# Patient Record
Sex: Male | Born: 1940 | Race: White | Hispanic: No | Marital: Married | Smoking: Former smoker
Health system: Southern US, Community
[De-identification: ages and names within clinical notes are randomized; demographics above are authoritative.]

## PROBLEM LIST (undated history)

## (undated) DIAGNOSIS — K8020 Calculus of gallbladder without cholecystitis without obstruction: Secondary | ICD-10-CM

## (undated) DIAGNOSIS — R413 Other amnesia: Secondary | ICD-10-CM

---

## 1999-12-08 ENCOUNTER — Other Ambulatory Visit: Admission: RE | Admit: 1999-12-08 | Discharge: 1999-12-08 | Payer: Self-pay | Admitting: Gastroenterology

## 2000-03-29 ENCOUNTER — Ambulatory Visit (HOSPITAL_COMMUNITY): Admission: RE | Admit: 2000-03-29 | Discharge: 2000-03-29 | Payer: Self-pay | Admitting: Gastroenterology

## 2005-02-23 HISTORY — PX: COLON SURGERY: SHX602

## 2005-03-25 ENCOUNTER — Ambulatory Visit (HOSPITAL_COMMUNITY): Admission: RE | Admit: 2005-03-25 | Discharge: 2005-03-25 | Payer: Self-pay | Admitting: Gastroenterology

## 2005-07-02 ENCOUNTER — Inpatient Hospital Stay (HOSPITAL_COMMUNITY): Admission: RE | Admit: 2005-07-02 | Discharge: 2005-07-10 | Payer: Self-pay | Admitting: General Surgery

## 2006-02-23 HISTORY — PX: OTHER SURGICAL HISTORY: SHX169

## 2008-02-24 HISTORY — PX: SHOULDER SURGERY: SHX246

## 2011-11-25 ENCOUNTER — Other Ambulatory Visit: Payer: Self-pay | Admitting: Geriatric Medicine

## 2011-11-25 DIAGNOSIS — R4701 Aphasia: Secondary | ICD-10-CM

## 2011-11-29 ENCOUNTER — Other Ambulatory Visit: Payer: Self-pay

## 2011-11-30 ENCOUNTER — Ambulatory Visit
Admission: RE | Admit: 2011-11-30 | Discharge: 2011-11-30 | Disposition: A | Discharge status: Home / Self Care | Payer: Medicare Other | Source: Ambulatory Visit | Attending: Geriatric Medicine | Admitting: Geriatric Medicine

## 2011-11-30 DIAGNOSIS — R4701 Aphasia: Secondary | ICD-10-CM

## 2011-11-30 MED ORDER — GADOBENATE DIMEGLUMINE 529 MG/ML IV SOLN
14.0000 mL | Freq: Once | INTRAVENOUS | Status: AC | PRN
  Administered 2011-11-30: 14 mL via INTRAVENOUS

## 2011-12-17 ENCOUNTER — Other Ambulatory Visit: Payer: Self-pay | Admitting: Geriatric Medicine

## 2011-12-17 ENCOUNTER — Ambulatory Visit (HOSPITAL_COMMUNITY)
Admission: RE | Admit: 2011-12-17 | Discharge: 2011-12-17 | Disposition: A | Discharge status: Home / Self Care | Payer: Medicare Other | Source: Ambulatory Visit | Attending: Geriatric Medicine | Admitting: Geriatric Medicine

## 2011-12-17 ENCOUNTER — Other Ambulatory Visit (HOSPITAL_COMMUNITY): Payer: Self-pay | Admitting: Geriatric Medicine

## 2011-12-17 DIAGNOSIS — K838 Other specified diseases of biliary tract: Secondary | ICD-10-CM | POA: Insufficient documentation

## 2011-12-17 DIAGNOSIS — R109 Unspecified abdominal pain: Secondary | ICD-10-CM

## 2011-12-17 DIAGNOSIS — K7689 Other specified diseases of liver: Secondary | ICD-10-CM | POA: Insufficient documentation

## 2011-12-17 DIAGNOSIS — R1011 Right upper quadrant pain: Secondary | ICD-10-CM

## 2011-12-17 DIAGNOSIS — K8020 Calculus of gallbladder without cholecystitis without obstruction: Secondary | ICD-10-CM | POA: Insufficient documentation

## 2011-12-17 DIAGNOSIS — Q619 Cystic kidney disease, unspecified: Secondary | ICD-10-CM | POA: Insufficient documentation

## 2011-12-18 ENCOUNTER — Other Ambulatory Visit: Payer: Medicare Other

## 2011-12-18 ENCOUNTER — Ambulatory Visit (HOSPITAL_COMMUNITY): Payer: Medicare Other

## 2011-12-18 ENCOUNTER — Other Ambulatory Visit (HOSPITAL_COMMUNITY): Payer: Medicare Other

## 2012-01-11 ENCOUNTER — Encounter (INDEPENDENT_AMBULATORY_CARE_PROVIDER_SITE_OTHER): Payer: Self-pay | Admitting: General Surgery

## 2012-01-11 ENCOUNTER — Ambulatory Visit (INDEPENDENT_AMBULATORY_CARE_PROVIDER_SITE_OTHER): Payer: Medicare Other | Admitting: General Surgery

## 2012-01-11 VITALS — BP 144/80 | HR 73 | Temp 99.0°F | Ht 68.5 in | Wt 161.4 lb

## 2012-01-11 DIAGNOSIS — K8020 Calculus of gallbladder without cholecystitis without obstruction: Secondary | ICD-10-CM | POA: Insufficient documentation

## 2012-01-11 NOTE — Progress Notes (Signed)
Patient ID: Eric Combs, male   DOB: Jan 18, 1941, 71 y.o.   MRN: [id number]  Chief Complaint  Patient presents with  . Pre-op Exam    eval gallstones    HPI Eric Combs is a 71 y.o. male.   HPI  He is referred by Dr. Pete Glatter for further evaluation of right upper quadrant pain and gallstones.  He has had 2 episodes of right upper quadrant pain 2 hours after eating. No nausea or vomiting. No fever or chills. One episode lasted 10 hours. He subsequently underwent an abdominal ultrasound which demonstrated multiple gallstones with the largest one measuring 1.1 cm. There was no significant gallbladder wall thickening. The common bile duct diameter was 7 mm.  History reviewed. No pertinent past medical history.  Past Surgical History  Procedure Date  . Shoulder surgery 2010    right  . Polyp removal 2008  . Colon surgery 2007     Small bowel resection  Family History  Problem Relation Age of Onset  . Cancer Father     kidney,lung,brain    Social History History  Substance Use Topics  . Smoking status: Former Smoker    Quit date: 01/11/2008  . Smokeless tobacco: Not on file  . Alcohol Use: No    No Known Allergies  Current Outpatient Prescriptions  Medication Sig Dispense Refill  . donepezil (ARICEPT) 5 MG tablet Take 5 mg by mouth at bedtime as needed.        Review of Systems Review of Systems  Constitutional: Negative for fever and chills.  Respiratory: Negative for shortness of breath.   Cardiovascular: Negative for chest pain.  Gastrointestinal: Positive for abdominal pain. Negative for nausea and vomiting.  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    Blood pressure 144/80, pulse 73, temperature 99 F (37.2 C), temperature source Temporal, height 5' 8.5" (1.74 m), weight 161 lb 6.4 oz (73.211 kg), SpO2 98.00%.  Physical Exam Physical Exam  Constitutional: He appears well-developed and well-nourished. No distress.  HENT:    Head: Normocephalic and atraumatic.  Eyes: EOM are normal. No scleral icterus.  Neck: Neck supple.  Cardiovascular: Normal rate and regular rhythm.   Pulmonary/Chest: Effort normal and breath sounds normal.  Abdominal: Soft. He exhibits no distension and no mass. There is no tenderness.       Midline scar.  Musculoskeletal: Normal range of motion. He exhibits edema.  Lymphadenopathy:    He has no cervical adenopathy.  Neurological: He is alert.       Answers questions appropriately.  Skin: Skin is warm and dry.    Data Reviewed [place] report, Dr. Laverle Hobby notes, old chart.  Assessment    His right upper quadrant pain is very suggestive of symptomatic cholelithiasis and we discussed this.    Plan    We talked about a laparoscopic cholecystectomy for this and he is interested in proceeding.  I have explained the procedure, risks, and aftercare of cholecystectomy.  Risks include but are not limited to bleeding, infection, wound problems, anesthesia, diarrhea, bile leak, injury to common bile duct/liver/intestine.  He seems to understand and agrees to proceed.        Merdith Adan J 01/11/2012, 5:57 PM

## 2012-01-11 NOTE — Patient Instructions (Signed)
Strict lowfat diet. 

## 2012-02-05 ENCOUNTER — Encounter (HOSPITAL_COMMUNITY): Payer: Self-pay | Admitting: Pharmacy Technician

## 2012-02-09 ENCOUNTER — Encounter (HOSPITAL_COMMUNITY)
Admission: RE | Admit: 2012-02-09 | Discharge: 2012-02-09 | Disposition: A | Discharge status: Home / Self Care | Payer: Medicare Other | Source: Ambulatory Visit | Attending: General Surgery | Admitting: General Surgery

## 2012-02-09 ENCOUNTER — Encounter (HOSPITAL_COMMUNITY): Payer: Self-pay

## 2012-02-09 HISTORY — DX: Other amnesia: R41.3

## 2012-02-09 HISTORY — DX: Calculus of gallbladder without cholecystitis without obstruction: K80.20

## 2012-02-09 LAB — CBC WITH DIFFERENTIAL/PLATELET
Basophils Absolute: 0.1 10*3/uL (ref 0.0–0.1)
Hemoglobin: 14.7 g/dL (ref 13.0–17.0)
Lymphs Abs: 1.1 10*3/uL (ref 0.7–4.0)
MCH: 27.9 pg (ref 26.0–34.0)
Neutro Abs: 4.9 10*3/uL (ref 1.7–7.7)
Neutrophils Relative %: 74 % (ref 43–77)
RDW: 14.2 % (ref 11.5–15.5)

## 2012-02-09 LAB — COMPREHENSIVE METABOLIC PANEL
Albumin: 4 g/dL (ref 3.5–5.2)
CO2: 28 mEq/L (ref 19–32)
Chloride: 102 mEq/L (ref 96–112)
GFR calc Af Amer: >90 mL/min (ref >90)
GFR calc non Af Amer: 82 mL/min — ABNORMAL LOW (ref >90)
Potassium: 4.5 mEq/L (ref 3.5–5.1)
Sodium: 140 mEq/L (ref 135–145)
Total Bilirubin: 0.6 mg/dL (ref 0.3–1.2)
Total Protein: 7.4 g/dL (ref 6.0–8.3)

## 2012-02-09 LAB — SURGICAL PCR SCREEN
MRSA, PCR: NEGATIVE
Staphylococcus aureus: POSITIVE — AB

## 2012-02-09 NOTE — Patient Instructions (Signed)
YOUR SURGERY IS SCHEDULED AT [facility]  ON:  Friday  12/20  REPORT TO [facility] AT: 9:30 AM      PHONE # FOR SHORT STAY IS [phone] NO ASPIRIN OR HERBALS OR BLOOD THINNERS   DO NOT EAT OR DRINK ANYTHING AFTER MIDNIGHT THE NIGHT BEFORE YOUR SURGERY.  YOU MAY BRUSH YOUR TEETH, RINSE OUT YOUR MOUTH--BUT NO WATER, NO FOOD, NO CHEWING GUM, NO MINTS, NO CANDIES, NO CHEWING TOBACCO.  PLEASE TAKE THE FOLLOWING MEDICATIONS THE AM OF YOUR SURGERY WITH A FEW SIPS OF WATER:  NO MEDICINES TO TAKE.  IF YOU USE INHALERS--USE YOUR INHALERS THE AM OF YOUR SURGERY AND BRING INHALERS TO THE HOSPITAL -TAKE TO SURGERY.    IF YOU ARE DIABETIC:  DO NOT TAKE ANY DIABETIC MEDICATIONS THE AM OF YOUR SURGERY.  IF YOU TAKE INSULIN IN THE EVENINGS--PLEASE ONLY TAKE 1/2 NORMAL EVENING DOSE THE NIGHT BEFORE YOUR SURGERY.  NO INSULIN THE AM OF YOUR SURGERY.  IF YOU HAVE SLEEP APNEA AND USE CPAP OR BIPAP--PLEASE BRING THE MASK AND THE TUBING.  DO NOT BRING YOUR MACHINE.  DO NOT BRING VALUABLES, MONEY, CREDIT CARDS.  DO NOT WEAR JEWELRY, MAKE-UP, NAIL POLISH AND NO METAL PINS OR CLIPS IN YOUR HAIR. CONTACT LENS, DENTURES / PARTIALS, GLASSES SHOULD NOT BE WORN TO SURGERY AND IN MOST CASES-HEARING AIDS WILL NEED TO BE REMOVED.  BRING YOUR GLASSES CASE, ANY EQUIPMENT NEEDED FOR YOUR CONTACT LENS. FOR PATIENTS ADMITTED TO THE HOSPITAL--CHECK OUT TIME THE DAY OF DISCHARGE IS 11:00 AM.  ALL INPATIENT ROOMS ARE PRIVATE - WITH BATHROOM, TELEPHONE, TELEVISION AND WIFI INTERNET.  IF YOU ARE BEING DISCHARGED THE SAME DAY OF YOUR SURGERY--YOU CAN NOT DRIVE YOURSELF HOME--AND SHOULD NOT GO HOME ALONE BY TAXI OR BUS.  NO DRIVING OR OPERATING MACHINERY FOR 24 HOURS FOLLOWING ANESTHESIA / PAIN MEDICATIONS.  PLEASE MAKE ARRANGEMENTS FOR SOMEONE TO BE WITH YOU AT HOME THE FIRST 24 HOURS AFTER SURGERY. RESPONSIBLE DRIVER'S NAME___________________________                                               PHONE #    _______________________                               PLEASE READ OVER ANY  FACT SHEETS THAT YOU WERE GIVEN: MRSA INFORMATION, BLOOD TRANSFUSION INFORMATION, INCENTIVE SPIROMETER INFORMATION. FAILURE TO FOLLOW THESE INSTRUCTIONS MAY RESULT IN THE CANCELLATION OF YOUR SURGERY.   PATIENT SIGNATURE_________________________________

## 2012-02-09 NOTE — Pre-Procedure Instructions (Signed)
PREOP CBC, DIFF, CMET, PT WERE DONE TODAY AT [facility] AS PER ORDERS DR. Abbey Chatters.  EKG AND CXR NOT NEEDED PER ANESTHESIOLOGIST'S GUIDELINES.

## 2012-02-12 ENCOUNTER — Encounter (HOSPITAL_COMMUNITY)
Admission: RE | Disposition: A | Discharge status: Home / Self Care | Payer: Self-pay | Source: Ambulatory Visit | Attending: General Surgery

## 2012-02-12 ENCOUNTER — Encounter (HOSPITAL_COMMUNITY): Payer: Self-pay | Admitting: Anesthesiology

## 2012-02-12 ENCOUNTER — Ambulatory Visit (HOSPITAL_COMMUNITY)
Admission: RE | Admit: 2012-02-12 | Discharge: 2012-02-13 | Disposition: A | Discharge status: Home / Self Care | Payer: Medicare Other | Source: Ambulatory Visit | Attending: General Surgery | Admitting: General Surgery

## 2012-02-12 ENCOUNTER — Ambulatory Visit (HOSPITAL_COMMUNITY): Payer: Medicare Other | Admitting: Anesthesiology

## 2012-02-12 ENCOUNTER — Encounter (HOSPITAL_COMMUNITY): Payer: Self-pay | Admitting: *Deleted

## 2012-02-12 ENCOUNTER — Ambulatory Visit (HOSPITAL_COMMUNITY): Payer: Medicare Other

## 2012-02-12 DIAGNOSIS — Z79899 Other long term (current) drug therapy: Secondary | ICD-10-CM | POA: Insufficient documentation

## 2012-02-12 DIAGNOSIS — R413 Other amnesia: Secondary | ICD-10-CM | POA: Insufficient documentation

## 2012-02-12 DIAGNOSIS — K8020 Calculus of gallbladder without cholecystitis without obstruction: Secondary | ICD-10-CM | POA: Diagnosis present

## 2012-02-12 DIAGNOSIS — K8010 Calculus of gallbladder with chronic cholecystitis without obstruction: Secondary | ICD-10-CM

## 2012-02-12 DIAGNOSIS — Z01812 Encounter for preprocedural laboratory examination: Secondary | ICD-10-CM | POA: Insufficient documentation

## 2012-02-12 HISTORY — PX: CHOLECYSTECTOMY: SHX55

## 2012-02-12 SURGERY — LAPAROSCOPIC CHOLECYSTECTOMY WITH INTRAOPERATIVE CHOLANGIOGRAM
Anesthesia: General | Site: Abdomen | Wound class: Contaminated

## 2012-02-12 MED ORDER — HYDROCODONE-ACETAMINOPHEN 5-325 MG PO TABS: 1.0000 | ORAL_TABLET | ORAL | Status: DC | PRN

## 2012-02-12 MED ORDER — BUPIVACAINE-EPINEPHRINE PF 0.25-1:200000 % IJ SOLN
INTRAMUSCULAR | Status: AC
  Filled 2012-02-12: qty 30

## 2012-02-12 MED ORDER — ACETAMINOPHEN 10 MG/ML IV SOLN
INTRAVENOUS | Status: DC | PRN
  Administered 2012-02-12 (×2): 1000 mg via INTRAVENOUS

## 2012-02-12 MED ORDER — CEFAZOLIN SODIUM-DEXTROSE 2-3 GM-% IV SOLR
2.0000 g | INTRAVENOUS | Status: AC
  Administered 2012-02-12: 2 g via INTRAVENOUS

## 2012-02-12 MED ORDER — DONEPEZIL HCL 10 MG PO TABS
10.0000 mg | ORAL_TABLET | Freq: Every day | ORAL | Status: DC
  Administered 2012-02-12: 10 mg via ORAL
  Filled 2012-02-12 (×2): qty 1

## 2012-02-12 MED ORDER — HYDROCODONE-ACETAMINOPHEN 5-325 MG PO TABS
1.0000 | ORAL_TABLET | ORAL | Status: DC | PRN
  Administered 2012-02-12 (×2): 2 via ORAL
  Administered 2012-02-13: 1 via ORAL
  Filled 2012-02-12 (×2): qty 2
  Filled 2012-02-12: qty 1

## 2012-02-12 MED ORDER — CEFAZOLIN SODIUM-DEXTROSE 2-3 GM-% IV SOLR
INTRAVENOUS | Status: AC
  Filled 2012-02-12: qty 50

## 2012-02-12 MED ORDER — IOHEXOL 300 MG/ML  SOLN
INTRAMUSCULAR | Status: AC
  Filled 2012-02-12: qty 1

## 2012-02-12 MED ORDER — PROMETHAZINE HCL 25 MG/ML IJ SOLN: 6.2500 mg | INTRAMUSCULAR | Status: DC | PRN

## 2012-02-12 MED ORDER — FENTANYL CITRATE 0.05 MG/ML IJ SOLN
INTRAMUSCULAR | Status: DC | PRN
  Administered 2012-02-12 (×2): 100 ug via INTRAVENOUS
  Administered 2012-02-12 (×2): 50 ug via INTRAVENOUS

## 2012-02-12 MED ORDER — IOHEXOL 300 MG/ML  SOLN
INTRAMUSCULAR | Status: DC | PRN
  Administered 2012-02-12: 5 mL via INTRAVENOUS

## 2012-02-12 MED ORDER — BUPIVACAINE-EPINEPHRINE PF 0.25-1:200000 % IJ SOLN
INTRAMUSCULAR | Status: DC | PRN
  Administered 2012-02-12: 10 mL
  Administered 2012-02-12: 25 mL

## 2012-02-12 MED ORDER — HYDROMORPHONE HCL PF 1 MG/ML IJ SOLN: 0.2500 mg | INTRAMUSCULAR | Status: DC | PRN

## 2012-02-12 MED ORDER — ADULT MULTIVITAMIN W/MINERALS CH
1.0000 | ORAL_TABLET | Freq: Every day | ORAL | Status: DC
  Administered 2012-02-12 – 2012-02-13 (×2): 1 via ORAL
  Filled 2012-02-12 (×2): qty 1

## 2012-02-12 MED ORDER — LIDOCAINE HCL (CARDIAC) 20 MG/ML IV SOLN
INTRAVENOUS | Status: DC | PRN
  Administered 2012-02-12: 75 mg via INTRAVENOUS

## 2012-02-12 MED ORDER — MORPHINE SULFATE 2 MG/ML IJ SOLN
2.0000 mg | INTRAMUSCULAR | Status: DC | PRN
  Administered 2012-02-12: 2 mg via INTRAVENOUS
  Filled 2012-02-12: qty 1

## 2012-02-12 MED ORDER — NEOSTIGMINE METHYLSULFATE 1 MG/ML IJ SOLN
INTRAMUSCULAR | Status: DC | PRN
  Administered 2012-02-12: 3.5 mg via INTRAVENOUS

## 2012-02-12 MED ORDER — PROPOFOL 10 MG/ML IV BOLUS
INTRAVENOUS | Status: DC | PRN
  Administered 2012-02-12: 16 mg via INTRAVENOUS

## 2012-02-12 MED ORDER — KCL IN DEXTROSE-NACL 20-5-0.9 MEQ/L-%-% IV SOLN
INTRAVENOUS | Status: DC
  Administered 2012-02-12 – 2012-02-13 (×2): via INTRAVENOUS
  Filled 2012-02-12 (×2): qty 1000

## 2012-02-12 MED ORDER — KETOROLAC TROMETHAMINE 30 MG/ML IJ SOLN: 15.0000 mg | Freq: Once | INTRAMUSCULAR | Status: DC | PRN

## 2012-02-12 MED ORDER — LACTATED RINGERS IR SOLN
Status: DC | PRN
  Administered 2012-02-12: 3000 mL

## 2012-02-12 MED ORDER — ACETAMINOPHEN 10 MG/ML IV SOLN
INTRAVENOUS | Status: AC
  Filled 2012-02-12: qty 100

## 2012-02-12 MED ORDER — LACTATED RINGERS IV SOLN
INTRAVENOUS | Status: DC
  Administered 2012-02-12: 13:00:00 via INTRAVENOUS
  Administered 2012-02-12: 1000 mL via INTRAVENOUS

## 2012-02-12 MED ORDER — GLYCOPYRROLATE 0.2 MG/ML IJ SOLN
INTRAMUSCULAR | Status: DC | PRN
  Administered 2012-02-12: .5 mg via INTRAVENOUS

## 2012-02-12 MED ORDER — ONDANSETRON HCL 4 MG PO TABS: 4.0000 mg | ORAL_TABLET | Freq: Four times a day (QID) | ORAL | Status: DC | PRN

## 2012-02-12 MED ORDER — ROCURONIUM BROMIDE 100 MG/10ML IV SOLN
INTRAVENOUS | Status: DC | PRN
  Administered 2012-02-12: 40 mg via INTRAVENOUS

## 2012-02-12 MED ORDER — ONDANSETRON HCL 4 MG/2ML IJ SOLN: 4.0000 mg | INTRAMUSCULAR | Status: DC | PRN

## 2012-02-12 MED ORDER — ONDANSETRON HCL 4 MG/2ML IJ SOLN
INTRAMUSCULAR | Status: DC | PRN
  Administered 2012-02-12: 4 mg via INTRAVENOUS

## 2012-02-12 SURGICAL SUPPLY — 45 items
APL SKNCLS STERI-STRIP NONHPOA (GAUZE/BANDAGES/DRESSINGS) ×1
APPLIER CLIP 5 13 M/L LIGAMAX5 (MISCELLANEOUS) ×2
APPLIER CLIP ROT 10 11.4 M/L (STAPLE)
APR CLP MED LRG 11.4X10 (STAPLE)
APR CLP MED LRG 5 ANG JAW (MISCELLANEOUS) ×1
BAG SPEC RTRVL LRG 6X4 10 (ENDOMECHANICALS)
BENZOIN TINCTURE PRP APPL 2/3 (GAUZE/BANDAGES/DRESSINGS) ×2 IMPLANT
CANISTER SUCTION 2500CC (MISCELLANEOUS) ×2 IMPLANT
CHLORAPREP W/TINT 26ML (MISCELLANEOUS) ×2 IMPLANT
CLIP APPLIE 5 13 M/L LIGAMAX5 (MISCELLANEOUS) ×1 IMPLANT
CLIP APPLIE ROT 10 11.4 M/L (STAPLE) IMPLANT
CLOTH BEACON ORANGE TIMEOUT ST (SAFETY) ×2 IMPLANT
COVER MAYO STAND STRL (DRAPES) ×1 IMPLANT
COVER SURGICAL LIGHT HANDLE (MISCELLANEOUS) ×1 IMPLANT
DECANTER SPIKE VIAL GLASS SM (MISCELLANEOUS) ×2 IMPLANT
DRAPE C-ARM 42X72 X-RAY (DRAPES) ×1 IMPLANT
DRAPE LAPAROSCOPIC ABDOMINAL (DRAPES) ×2 IMPLANT
DRAPE UTILITY XL STRL (DRAPES) ×2 IMPLANT
DRSG TEGADERM 2-3/8X2-3/4 SM (GAUZE/BANDAGES/DRESSINGS) ×8 IMPLANT
ELECT REM PT RETURN 9FT ADLT (ELECTROSURGICAL) ×2
ELECTRODE REM PT RTRN 9FT ADLT (ELECTROSURGICAL) ×1 IMPLANT
ENDOLOOP SUT PDS II  0 18 (SUTURE)
ENDOLOOP SUT PDS II 0 18 (SUTURE) IMPLANT
GLOVE BIOGEL PI IND STRL 7.0 (GLOVE) ×1 IMPLANT
GLOVE BIOGEL PI INDICATOR 7.0 (GLOVE) ×1
GLOVE ECLIPSE 8.0 STRL XLNG CF (GLOVE) ×2 IMPLANT
GLOVE INDICATOR 8.0 STRL GRN (GLOVE) ×4 IMPLANT
GOWN STRL NON-REIN LRG LVL3 (GOWN DISPOSABLE) ×3 IMPLANT
GOWN STRL REIN XL XLG (GOWN DISPOSABLE) ×5 IMPLANT
HEMOSTAT SURGICEL 4X8 (HEMOSTASIS) ×1 IMPLANT
IV CATH 14GX2 1/4 (CATHETERS) IMPLANT
KIT BASIN OR (CUSTOM PROCEDURE TRAY) ×2 IMPLANT
NS IRRIG 1000ML POUR BTL (IV SOLUTION) ×1 IMPLANT
POUCH SPECIMEN RETRIEVAL 10MM (ENDOMECHANICALS) IMPLANT
SET CHOLANGIOGRAPH MIX (MISCELLANEOUS) ×2 IMPLANT
SET IRRIG TUBING LAPAROSCOPIC (IRRIGATION / IRRIGATOR) ×2 IMPLANT
SOLUTION ANTI FOG 6CC (MISCELLANEOUS) ×2 IMPLANT
STRIP CLOSURE SKIN 1/2X4 (GAUZE/BANDAGES/DRESSINGS) ×2 IMPLANT
SUT MNCRL AB 4-0 PS2 18 (SUTURE) ×2 IMPLANT
TOWEL OR 17X26 10 PK STRL BLUE (TOWEL DISPOSABLE) ×2 IMPLANT
TRAY LAP CHOLE (CUSTOM PROCEDURE TRAY) ×2 IMPLANT
TROCAR BLADELESS OPT 5 75 (ENDOMECHANICALS) ×6 IMPLANT
TROCAR XCEL BLUNT TIP 100MML (ENDOMECHANICALS) ×2 IMPLANT
TROCAR XCEL NON-BLD 11X100MML (ENDOMECHANICALS) ×1 IMPLANT
TUBING INSUFFLATION 10FT LAP (TUBING) ×2 IMPLANT

## 2012-02-12 NOTE — Anesthesia Postprocedure Evaluation (Signed)
  Anesthesia Post-op Note  Patient: Eric Combs  Procedure(s) Performed: Procedure(s) (LRB): LAPAROSCOPIC CHOLECYSTECTOMY WITH INTRAOPERATIVE CHOLANGIOGRAM (N/A)  Patient Location: PACU  Anesthesia Type: General  Level of Consciousness: awake and alert   Airway and Oxygen Therapy: Patient Spontanous Breathing  Post-op Pain: mild  Post-op Assessment: Post-op Vital signs reviewed, Patient's Cardiovascular Status Stable, Respiratory Function Stable, Patent Airway and No signs of Nausea or vomiting  Last Vitals:  Filed Vitals:   02/12/12 1500  BP: 131/81  Pulse: 62  Temp: 36.4 C  Resp: 16    Post-op Vital Signs: stable   Complications: No apparent anesthesia complications

## 2012-02-12 NOTE — Op Note (Signed)
Preoperative diagnosis:    Symptomatic cholelithiasis  Postoperative diagnosis:  Same  Procedure: Laparoscopic cholecystectomy with cholangiogram.  Surgeon: Avel Peace, M.D.  Asst.:  Axel Filler, M.D.  Anesthesia: General with Marcaine local  Indication:   This is a 71 year old male whose been having cases of biliary colic. He was discovered to have gallstones by ultrasound. He now presents for elective laparoscopic cholecystectomy.  Technique: He was brought to the operating room, placed supine on the operating table, and a general anesthetic was administered. The hair on the abdominal wall was clipped as was necessary. The abdominal wall was then sterilely prepped and draped.  A 5 mm incision was made in the subcostal region right upper quadrant. A 5 mm Optiview trocar and laparoscope were used to gain access to the peritoneal cavity and pneumoperitoneum was created. The laparoscope was introduced into the trocar and no underlying bleeding or organ injury was noted. The patient was then placed in the reverse Trendelenburg position with the right side tilted slightly up.  Adhesions were noted between the small bowel and omentum and lower midline abdominal wall.  Three more trochars were then placed into the abdominal cavity under laparoscopic vision. One in the epigastric area, A second trocar in the right upper quadrant area, and a 12 mm trocar in the subumbilical area through the previous midline incision.. The gallbladder was visualized and the fundus was grasped and retracted toward the right shoulder.  The infundibulum was mobilized with dissection close to the gallbladder and retracted laterally. The cystic duct was identified and a window was created around it. The anterior branch of the cystic artery was also identified and a window was created around it. It was clipped and divided.The critical view was achieved. A clip was placed at the neck of the gallbladder. A small incision was  made in the cystic duct. A cholangiocatheter was introduced through the anterior abdominal wall and placed in the cystic duct. A intraoperative cholangiogram was then performed.  Under real-time fluoroscopy, dilute contrast was injected into the cystic duct.  The common hepatic duct, the right and left hepatic ducts, and the common duct were all visualized. Contrast drained into the duodenum without obvious evidence of any obstructing ductal lesion. The final report is pending the Radiologist's interpretation.  The cholangiocatheter was removed, the cystic duct was clipped 3 times on the biliary side, and then the cystic duct was divided sharply. No bile leak was noted from the cystic duct stump.  The posterior branch of the cystic artery was identified, then clipped and divided. Following this the gallbladder was dissected free from the liver using electrocautery. Some bile spilled from a small puncture in the gallbladder but no stones were spilled.   The gallbladder was then placed in a retrieval bag. The gallbladder fossa was copiously irrigated with saline solution. Inspection showed no evidence of bleeding or bile leak. A piece of Surgicel was placed in the gallbladder fossa. The gallbladder was removed through the 12 mm port incision.  The fascia of this incision was then closed using Endo Close device and a 0 Vicryl suture.  The remaining trochars were removed and the pneumoperitoneum was released. The skin incisions were closed with 4-0 Monocryl subcuticular stitches. Steri-Strips and sterile dressings were applied.  The procedure was well-tolerated without any apparent complications. The patient was taken to the recovery room in satisfactory condition.

## 2012-02-12 NOTE — H&P (Signed)
Eric Combs is an 71 y.o. male.   Chief Complaint:   Here for elective laparoscopic cholecystectomy HPI:  He has had 2 episodes of right upper quadrant pain 2 hours after eating. No nausea or vomiting. No fever or chills. One episode lasted 10 hours. He subsequently underwent an abdominal ultrasound which demonstrated multiple gallstones with the largest one measuring 1.1 cm. There was no significant gallbladder wall thickening. The common bile duct diameter was 7 mm.   Past Medical History  Diagnosis Date  . Memory difficulties     PT ON ARICEPT  . Gallstones     EPISODES OF ABD PAIN    Past Surgical History  Procedure Date  . Shoulder surgery 2010    right  . Polyp removal 2008  . Colon surgery 2007    Family History  Problem Relation Age of Onset  . Cancer Father     kidney,lung,brain   Social History:  reports that he quit smoking about 4 years ago. His smoking use included Cigarettes. He has a 11.25 pack-year smoking history. He does not have any smokeless tobacco history on file. He reports that he does not drink alcohol or use illicit drugs.  Allergies: No Known Allergies  Medications Prior to Admission  Medication Sig Dispense Refill  . donepezil (ARICEPT) 5 MG tablet Take 10 mg by mouth at bedtime.       . Multiple Vitamin (MULTIVITAMIN WITH MINERALS) TABS Take 1 tablet by mouth daily.        No results found for this or any previous visit (from the past 48 hour(s)). No results found.  Review of Systems  Constitutional: Negative for fever and chills.  Gastrointestinal: Negative for nausea, vomiting and diarrhea.    Blood pressure 152/73, pulse 72, temperature 97.4 F (36.3 C), temperature source Oral, resp. rate 16, SpO2 100.00%. Physical Exam  Constitutional: He appears well-developed and well-nourished. No distress.  HENT:  Head: Normocephalic and atraumatic.  Eyes: No scleral icterus.  Cardiovascular: Normal rate and regular rhythm.   Respiratory:  Effort normal and breath sounds normal.  GI: Soft. He exhibits no mass. There is no tenderness.  Musculoskeletal: He exhibits no edema.     Assessment/Plan Symptomatic cholelithiasis.  Plan laparoscopic cholecystectomy. The procedure and risks have been discussed with him and his family preoperatively.  Chani Ghanem J 02/12/2012, 11:26 AM

## 2012-02-12 NOTE — Interval H&P Note (Signed)
History and Physical Interval Note:  02/12/2012 11:29 AM  Eric Combs  has presented today for surgery, with the diagnosis of symptomatic cholelithiasis  The various methods of treatment have been discussed with the patient and family. After consideration of risks, benefits and other options for treatment, the patient has consented to  Procedure(s) (LRB) with comments: LAPAROSCOPIC CHOLECYSTECTOMY WITH INTRAOPERATIVE CHOLANGIOGRAM (N/A) as a surgical intervention .  The patient's history has been reviewed, patient examined, no change in status, stable for surgery.  I have reviewed the patient's chart and labs.  Questions were answered to the patient's satisfaction.     [name] Shela Commons

## 2012-02-12 NOTE — Brief Op Note (Signed)
02/12/2012  1:01 PM  PATIENT:  Eric Combs  71 y.o. male  PRE-OPERATIVE DIAGNOSIS:  symptomatic cholelithiasis  POST-OPERATIVE DIAGNOSIS:  symptomatic cholelithiasis  PROCEDURE:  Procedure(s) (LRB) with comments: LAPAROSCOPIC CHOLECYSTECTOMY WITH INTRAOPERATIVE CHOLANGIOGRAM (N/A)  SURGEON:  Surgeon(s) and Role:    * Adolph Pollack, MD - Primary   PHYSICIAN ASSISTANT:   ASSISTANTS: Axel Filler, M.D.   ANESTHESIA:   general  EBL:  Total I/O In: 1000 [I.V.:1000] Out: -   BLOOD ADMINISTERED:none  DRAINS: none   LOCAL MEDICATIONS USED:  MARCAINE     SPECIMEN:  Source of Specimen:  gallbladder  DISPOSITION OF SPECIMEN:  PATHOLOGY  COUNTS:  YES  TOURNIQUET:  * No tourniquets in log *  DICTATION: .Dragon Dictation  PLAN OF CARE: Admit for overnight observation  PATIENT DISPOSITION:  PACU - hemodynamically stable.   Delay start of Pharmacological VTE agent (>24hrs) due to surgical blood loss or risk of bleeding: not applicable

## 2012-02-12 NOTE — Anesthesia Preprocedure Evaluation (Addendum)
Anesthesia Evaluation  Patient identified by MRN, date of birth, ID band Patient awake    Reviewed: Allergy & Precautions, H&P , NPO status , Patient's Chart, lab work & pertinent test results  Airway Mallampati: II TM Distance: >3 FB Neck ROM: Full    Dental No notable dental hx.    Pulmonary neg pulmonary ROS,  breath sounds clear to auscultation  Pulmonary exam normal       Cardiovascular negative cardio ROS  Rhythm:Regular Rate:Normal     Neuro/Psych negative neurological ROS  negative psych ROS   GI/Hepatic negative GI ROS, Neg liver ROS,   Endo/Other  negative endocrine ROS  Renal/GU negative Renal ROS  negative genitourinary   Musculoskeletal negative musculoskeletal ROS (+)   Abdominal   Peds negative pediatric ROS (+)  Hematology negative hematology ROS (+)   Anesthesia Other Findings   Reproductive/Obstetrics negative OB ROS                           Anesthesia Physical Anesthesia Plan  ASA: II  Anesthesia Plan: General   Post-op Pain Management:    Induction: Intravenous  Airway Management Planned: Oral ETT  Additional Equipment:   Intra-op Plan:   Post-operative Plan: Extubation in OR  Informed Consent: I have reviewed the patients History and Physical, chart, labs and discussed the procedure including the risks, benefits and alternatives for the proposed anesthesia with the patient or authorized representative who has indicated his/her understanding and acceptance.   Dental advisory given  Plan Discussed with: CRNA and Surgeon  Anesthesia Plan Comments: (No versed)       Anesthesia Quick Evaluation

## 2012-02-12 NOTE — Transfer of Care (Signed)
Immediate Anesthesia Transfer of Care Note  Patient: Eric Combs  Procedure(s) Performed: Procedure(s) (LRB) with comments: LAPAROSCOPIC CHOLECYSTECTOMY WITH INTRAOPERATIVE CHOLANGIOGRAM (N/A)  Patient Location: PACU  Anesthesia Type:General  Level of Consciousness: awake, alert , oriented and patient cooperative  Airway & Oxygen Therapy: Patient Spontanous Breathing and Patient connected to face mask oxygen  Post-op Assessment: Report given to PACU RN and Post -op Vital signs reviewed and stable  Post vital signs: Reviewed and stable  Complications: No apparent anesthesia complications

## 2012-02-13 DIAGNOSIS — R413 Other amnesia: Secondary | ICD-10-CM | POA: Diagnosis present

## 2012-02-13 MED ORDER — HYDROCODONE-ACETAMINOPHEN 5-325 MG PO TABS: 1.0000 | ORAL_TABLET | ORAL | Status: AC | PRN

## 2012-02-13 NOTE — Progress Notes (Signed)
Assessment unchanged. Pt verbalized understanding of discharge instructions. Script x1 given as provided by MD. Instructions relayed to wife upon arrival to carry pt home. Wife reported that pt has an established MyChart account. Pt discharged via wc accompanied by NT and wife to meet awaiting vehicle to carry home.

## 2012-02-13 NOTE — Discharge Summary (Signed)
Physician Discharge Summary  Eric Combs ZOX:[id number] DOB: Mar 08, 1940 DOA: 02/12/2012  PCP: Ginette Otto, MD  Admit date: 02/12/2012 Discharge date: 02/13/2012  Recommendations for Outpatient Follow-up:  1. Follow-up with your primary care doctor within next 6 weeks  Follow-up Information    Follow up with ROSENBOWER,TODD J, MD. Schedule an appointment as soon as possible for a visit in 3 weeks.   Contact information:   [address] [place] [address] [phone]         Discharge Diagnoses:  1. Symptomatic cholelithiasis 2. Memory difficulities  Surgical Procedure: Laparoscopic cholecystectomy with IOC  Discharge Condition: good Disposition: to home  Diet recommendation: heart healthy  Filed Weights   02/12/12 1358  Weight: 158 lb 4.8 oz (71.804 kg)    Hospital Course:  71 -year-old Caucasian male admitted for planned laparoscopic cholecystectomy with interoperative cholangiogram. He was kept overnight for observation. Postoperatively he did quite well. His vital signs remained stable. On the morning of postoperative day 1 he was tolerating solid foods without nausea or vomiting. He had minimal abdominal discomfort. He was ambulating without difficulty.  BP 110/63  Pulse 65  Temp 97.7 F (36.5 C) (Oral)  Resp 16  Ht 5' 8.5" (1.74 m)  Wt 158 lb 4.8 oz (71.804 kg)  BMI 23.72 kg/m2  SpO2 97%  Gen: alert, NAD, non-toxic appearing Pupils: equal, no scleral icterus Pulm: Lungs clear to auscultation, symmetric chest rise CV: regular rate and rhythm Abd: soft, nontender, nondistended. Dressing c/d/i Ext: no edema, no calf tenderness Skin: no rash, no jaundice  Discharge Instructions - see [facility] discharge instructions      Discharge Orders    Future Orders Please Complete By Expires   Diet - low sodium heart healthy      Increase activity slowly          Medication List     As of 02/13/2012  8:00 AM    TAKE these medications          donepezil 5 MG tablet   Commonly known as: ARICEPT   Take 10 mg by mouth at bedtime.      HYDROcodone-acetaminophen 5-325 MG per tablet   Commonly known as: NORCO/VICODIN   Take 1-2 tablets by mouth every 4 (four) hours as needed.      multivitamin with minerals Tabs   Take 1 tablet by mouth daily.         Follow-up Information    Follow up with ROSENBOWER,TODD J, MD. Schedule an appointment as soon as possible for a visit in 3 weeks.   Contact information:   [address] [place] [address] [phone]           The results of significant diagnostics from this hospitalization (including imaging, microbiology, ancillary and laboratory) are listed below for reference.    Significant Diagnostic Studies: Dg Cholangiogram Operative  02/12/2012  *RADIOLOGY REPORT*  Clinical Data:   Cholelithiasis  INTRAOPERATIVE CHOLANGIOGRAM  Technique:  Cholangiographic images from the C-arm fluoroscopic device were submitted for interpretation post-operatively.  Please see the procedural report for the amount of contrast and the fluoroscopy time utilized.  Comparison:  None  Findings:  No persistent filling defects in the common duct. Intrahepatic ducts are incompletely visualized, appearing decompressed centrally. Contrast passes into the duodenum.  IMPRESSION  Negative for retained common duct stone.   Original Report Authenticated By: D. Andria Rhein, MD     Microbiology: Recent Results (from the past  240 hour(s))  SURGICAL PCR SCREEN     Status: Abnormal   Collection Time   02/09/12  8:05 AM      Component Value Range Status Comment   MRSA, PCR NEGATIVE  NEGATIVE Final    Staphylococcus aureus POSITIVE (*) NEGATIVE Final      Labs: Basic Metabolic Panel:  Lab 02/09/12 9562  NA 140  K 4.5  CL 102  CO2 28  GLUCOSE 107*  BUN 13  CREATININE 0.98  CALCIUM 9.8  MG --  PHOS --   Liver Function Tests:  Lab 02/09/12 0830  AST 17  ALT 12  ALKPHOS 76   BILITOT 0.6  PROT 7.4  ALBUMIN 4.0    CBC:  Lab 02/09/12 0830  WBC 6.6  NEUTROABS 4.9  HGB 14.7  HCT 44.1  MCV 83.8  PLT 176    Active Problems:  Symptomatic cholelithiasis  Memory difficulties s/p Lap chole with IOC  Time coordinating discharge: 10 min  Signed:  Atilano Ina, MD [facility], [place] [phone] 02/13/2012, 8:00 AM

## 2012-02-15 ENCOUNTER — Encounter (HOSPITAL_COMMUNITY): Payer: Self-pay | Admitting: General Surgery

## 2012-03-09 ENCOUNTER — Ambulatory Visit (INDEPENDENT_AMBULATORY_CARE_PROVIDER_SITE_OTHER): Payer: PRIVATE HEALTH INSURANCE | Admitting: General Surgery

## 2012-03-09 ENCOUNTER — Encounter (INDEPENDENT_AMBULATORY_CARE_PROVIDER_SITE_OTHER): Payer: Self-pay | Admitting: General Surgery

## 2012-03-09 VITALS — BP 142/82 | HR 64 | Temp 98.2°F | Resp 16 | Ht 69.0 in | Wt 161.2 lb

## 2012-03-09 DIAGNOSIS — Z9889 Other specified postprocedural states: Secondary | ICD-10-CM

## 2012-03-09 NOTE — Patient Instructions (Signed)
Activities as tolerated.  Lowfat diet.

## 2012-03-09 NOTE — Progress Notes (Signed)
He is here for a postop visit following laparoscopic cholecystectomy on 02/12/12.  Diet is being tolerated, bowels are moving.  He has some post-prandial diarrhea after eating spicy or fatty foods. No problems with incisions.  PE:  ABD:  Soft, incisions clean/dry/intact and solid.  Assessment:  Doing well postop.    Plan:  Lowfat diet recommended-this should help with the intermittent post-prandial diarrhea. Activities as tolerated.  Return visit prn.

## 2012-04-09 ENCOUNTER — Other Ambulatory Visit: Payer: Self-pay

## 2012-09-28 ENCOUNTER — Other Ambulatory Visit: Payer: Self-pay

## 2012-11-10 ENCOUNTER — Ambulatory Visit: Payer: Medicare Other | Attending: Geriatric Medicine | Admitting: Physical Therapy

## 2012-11-10 DIAGNOSIS — R293 Abnormal posture: Secondary | ICD-10-CM | POA: Insufficient documentation

## 2012-11-10 DIAGNOSIS — IMO0001 Reserved for inherently not codable concepts without codable children: Secondary | ICD-10-CM | POA: Insufficient documentation

## 2012-11-10 DIAGNOSIS — M545 Low back pain, unspecified: Secondary | ICD-10-CM | POA: Insufficient documentation

## 2012-11-15 ENCOUNTER — Ambulatory Visit: Payer: Medicare Other | Admitting: Rehabilitation

## 2012-11-17 ENCOUNTER — Ambulatory Visit: Payer: Medicare Other | Admitting: Physical Therapy

## 2012-11-22 ENCOUNTER — Ambulatory Visit: Payer: Medicare Other | Admitting: Physical Therapy

## 2012-11-24 ENCOUNTER — Ambulatory Visit: Payer: Medicare Other | Attending: Geriatric Medicine | Admitting: Physical Therapy

## 2012-11-24 DIAGNOSIS — R293 Abnormal posture: Secondary | ICD-10-CM | POA: Insufficient documentation

## 2012-11-24 DIAGNOSIS — M545 Low back pain, unspecified: Secondary | ICD-10-CM | POA: Insufficient documentation

## 2012-11-24 DIAGNOSIS — IMO0001 Reserved for inherently not codable concepts without codable children: Secondary | ICD-10-CM | POA: Insufficient documentation

## 2012-11-29 ENCOUNTER — Ambulatory Visit: Payer: Medicare Other | Admitting: Physical Therapy

## 2012-12-01 ENCOUNTER — Ambulatory Visit: Payer: Medicare Other | Admitting: Physical Therapy

## 2012-12-13 ENCOUNTER — Ambulatory Visit: Payer: Medicare Other | Admitting: Physical Therapy

## 2012-12-15 ENCOUNTER — Ambulatory Visit: Payer: Medicare Other | Admitting: Physical Therapy

## 2012-12-20 ENCOUNTER — Ambulatory Visit: Payer: Medicare Other | Admitting: Physical Therapy

## 2012-12-29 ENCOUNTER — Other Ambulatory Visit: Payer: Self-pay

## 2013-04-26 ENCOUNTER — Ambulatory Visit
Admission: RE | Admit: 2013-04-26 | Discharge: 2013-04-26 | Disposition: A | Discharge status: Home / Self Care | Payer: Medicare Other | Source: Ambulatory Visit | Attending: Nurse Practitioner | Admitting: Nurse Practitioner

## 2013-04-26 ENCOUNTER — Other Ambulatory Visit: Payer: Self-pay | Admitting: Nurse Practitioner

## 2013-04-26 DIAGNOSIS — R413 Other amnesia: Secondary | ICD-10-CM

## 2013-04-26 MED ORDER — GADOBENATE DIMEGLUMINE 529 MG/ML IV SOLN
15.0000 mL | Freq: Once | INTRAVENOUS | Status: AC | PRN
  Administered 2013-04-26: 15 mL via INTRAVENOUS

## 2013-06-27 IMAGING — US US ABDOMEN COMPLETE
1 series · 13 of 25 positions shown · non-contrast
Comparison: None.

CLINICAL DATA: Right upper quadrant pain.

COMPLETE ABDOMINAL ULTRASOUND

[Series 1: us abdomen complete · 0.28mm/px · 13 of 124 slices shown]
[im 1/124]
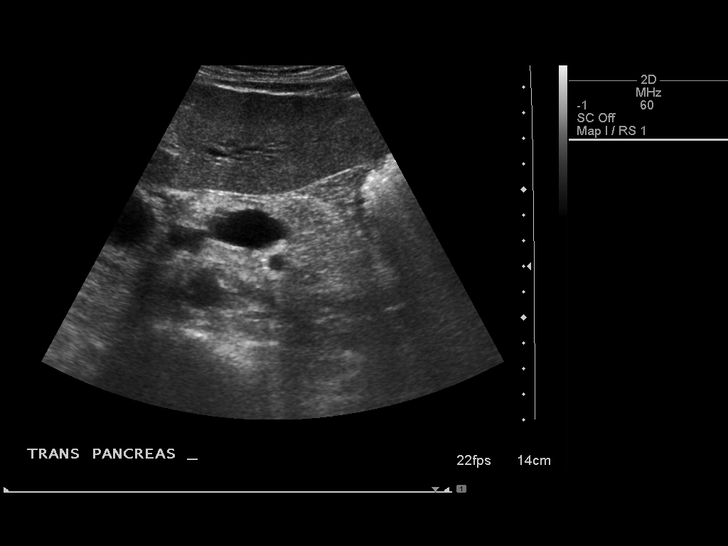
[im 11/124]
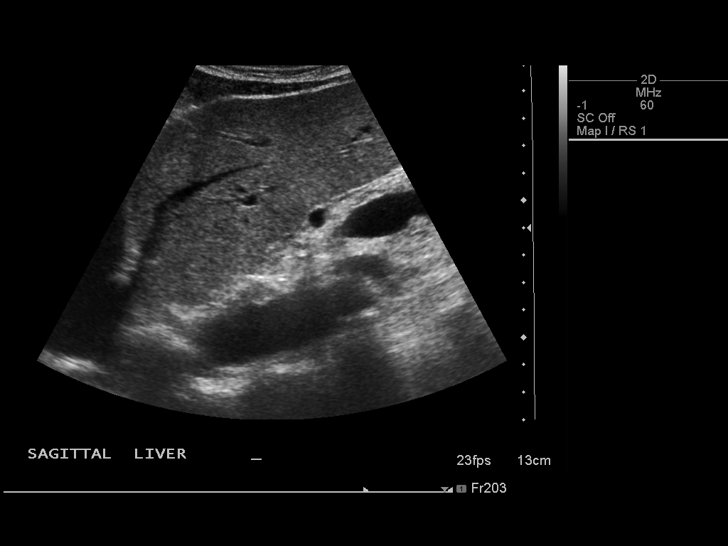
[im 21/124]
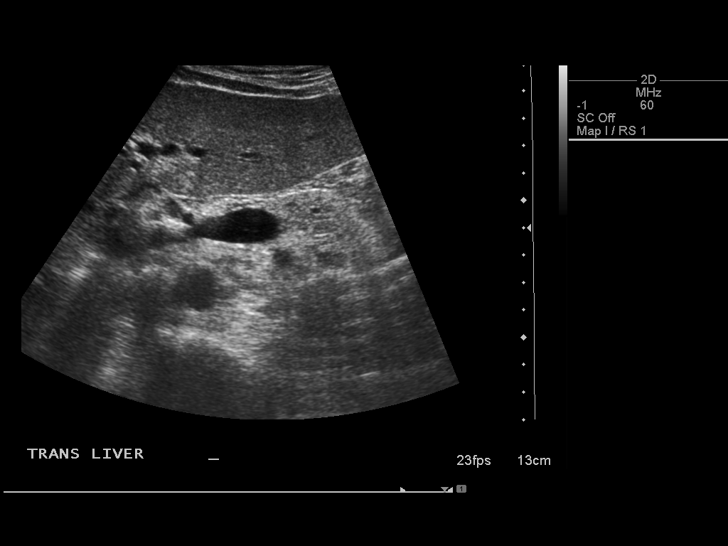
[im 31/124]
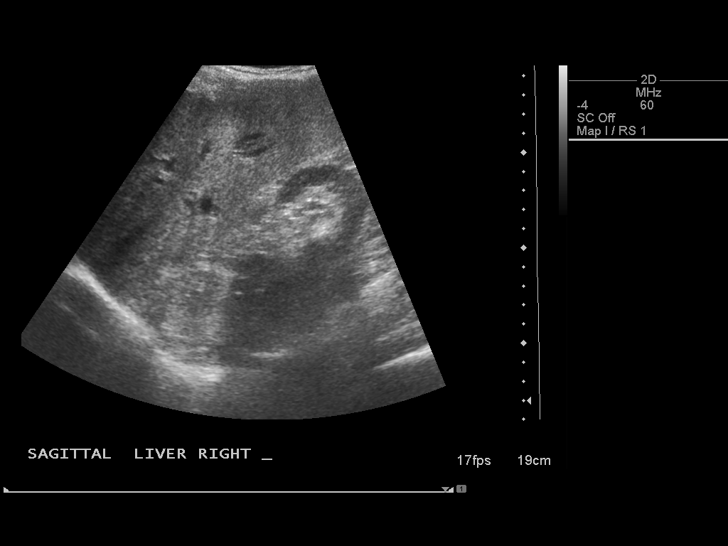
[im 42/124]
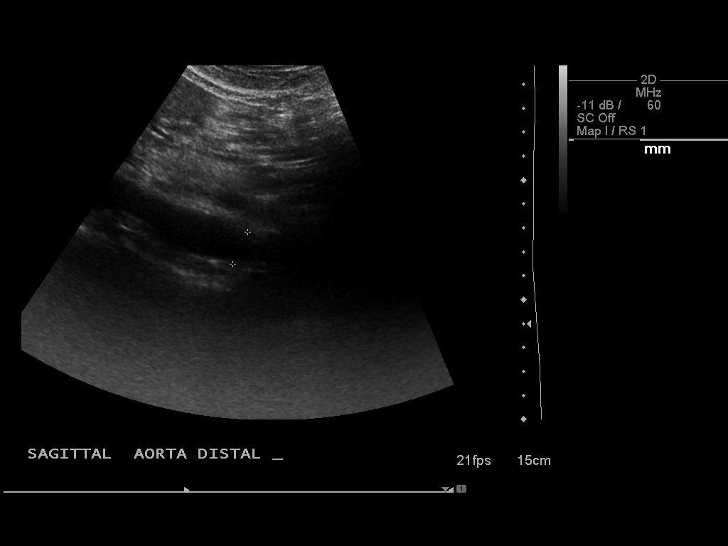
[im 52/124]
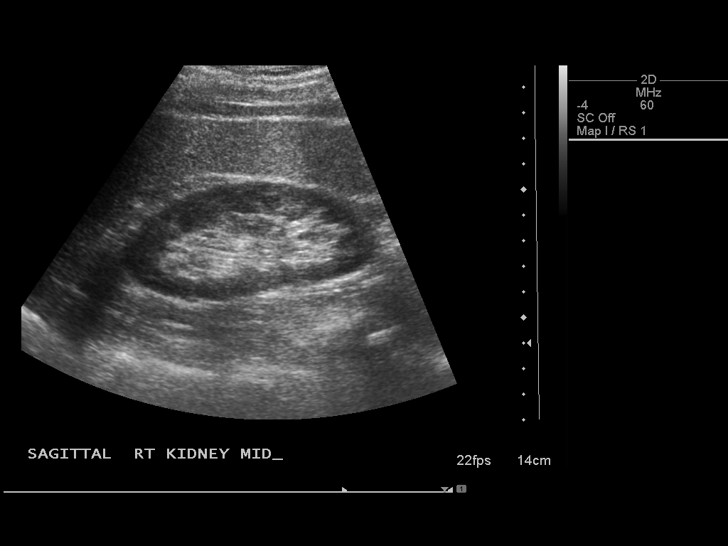
[im 62/124]
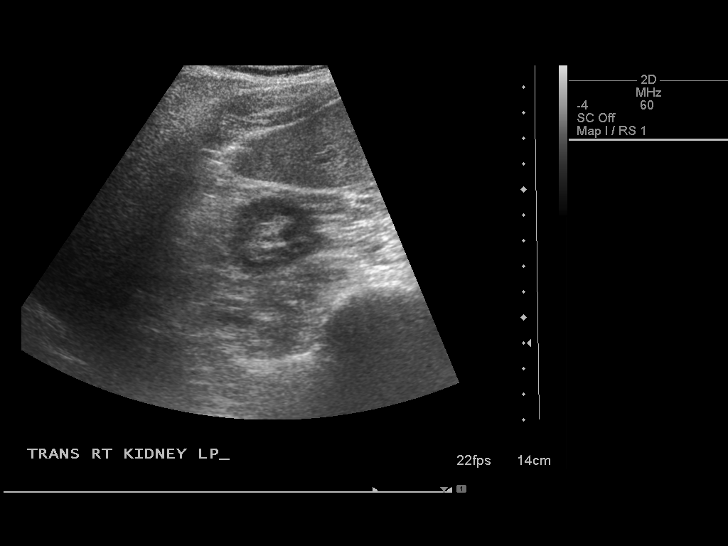
[im 72/124]
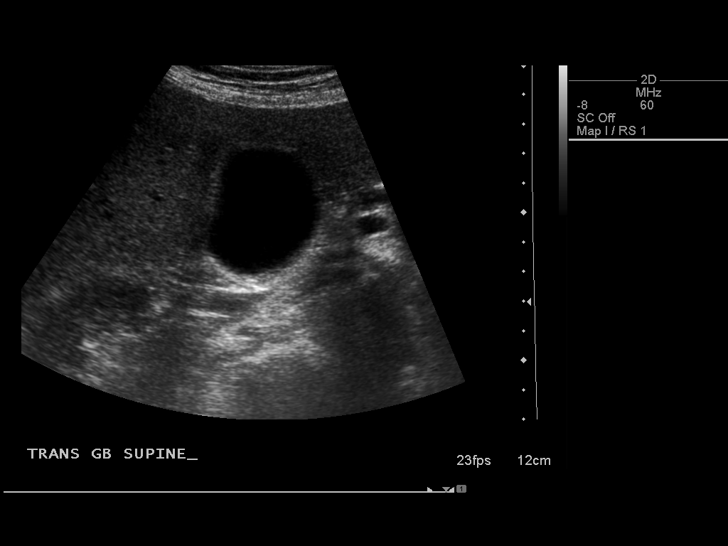
[im 83/124]
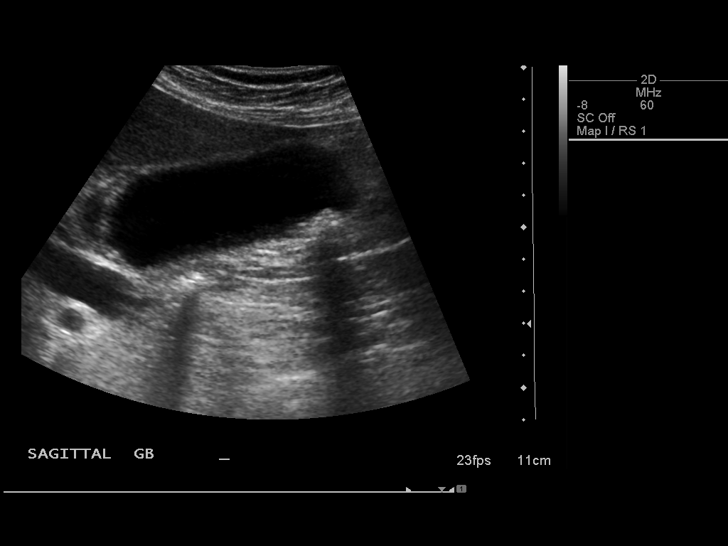
[im 93/124]
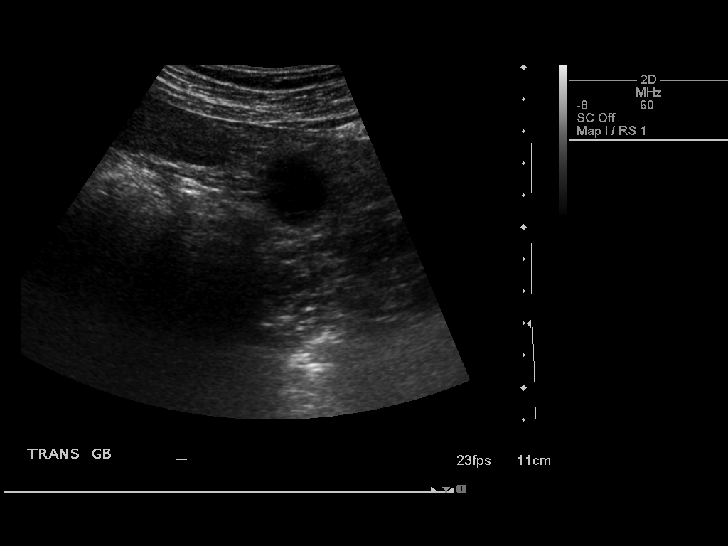
[im 103/124]
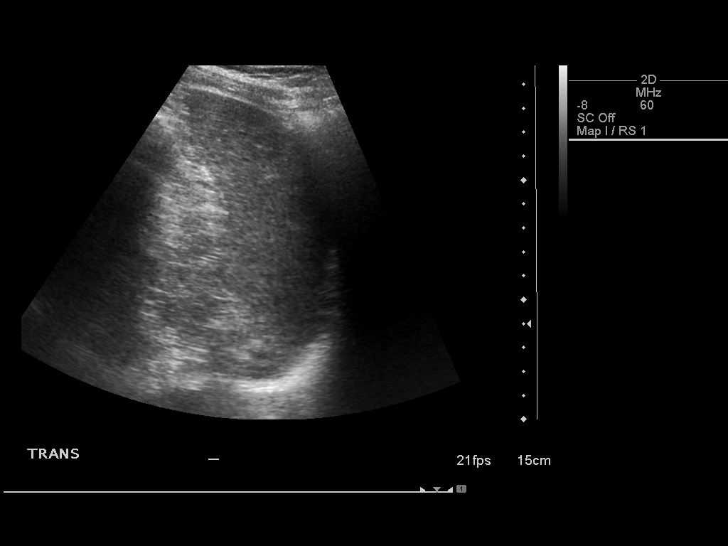
[im 113/124]
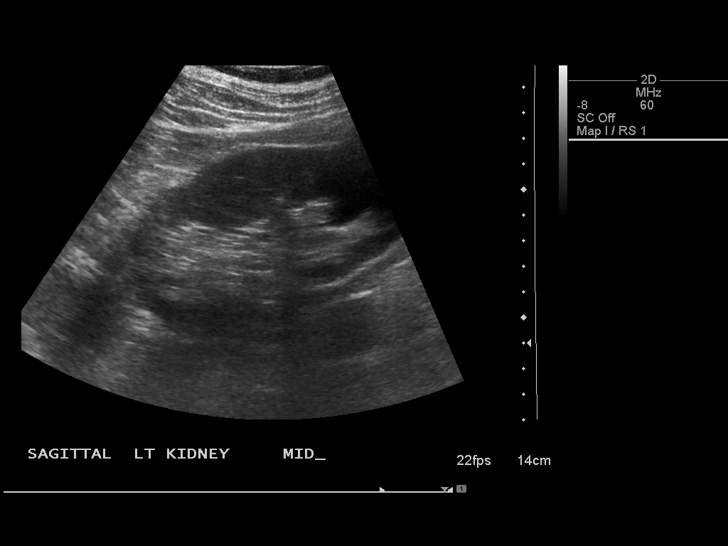
[im 124/124]
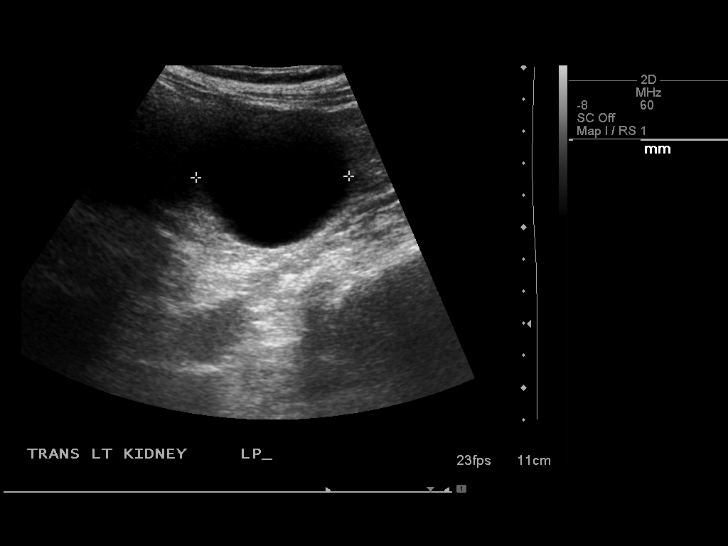

[13 of 25 positions shown; findings below may reference images not displayed]

FINDINGS: Gallbladder:  Gallbladder is mildly distended.  Echogenic stones
near the fundus with posterior acoustic shadowing.  Index stone
measures 1.1 cm.  No significant gallbladder wall thickening.
There is echogenic sludge layering within the gallbladder. The
patient does not have sonographic Murphy's sign.

Common bile duct:  Common bile duct measures up to 0.7 cm.

Liver:  There is a large hypoechoic cyst within the left hepatic
dome.  The cyst has posterior acoustic enhancement and measures
x 4.3 x 5.3 cm. Liver parenchyma is slightly echogenic.

IVC:  Limited evaluation.

Pancreas:  No focal abnormality seen.

Spleen:  Spleen measures 6.0 cm in length with normal appearance

Right Kidney:  Normal appearance of the right kidney without
hydronephrosis and the right kidney measures 10.3 cm in length.

Left Kidney:  Left kidney measures 11.8 cm in length without
hydronephrosis.  There is anechoic cyst in the lower pole with
posterior acoustic enhancement.  The cyst measures 5.3 x 4.3 x
cm.

Abdominal aorta:  No aneurysm identified.
IMPRESSION: Gallstones and gallbladder sludge.  No significant biliary
dilatation.

Large cysts involving the left kidney lower pole and the left
hepatic lobe.

## 2013-12-08 ENCOUNTER — Other Ambulatory Visit: Payer: Self-pay

## 2014-08-20 ENCOUNTER — Other Ambulatory Visit: Payer: Self-pay

## 2018-12-25 DEATH — deceased
